# Patient Record
Sex: Male | Born: 1995 | Hispanic: Yes | Marital: Married | Smoking: Current some day smoker
Health system: Southern US, Community
[De-identification: ages and names within clinical notes are randomized; demographics above are authoritative.]

---
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
## 2014-08-18 IMAGING — CR DG CHEST 2V
1 series · 2 of 2 positions shown · non-contrast
Comparison: none

REASON FOR EXAM: productive cough x 3 wks; hx positive PPD
COMMENTS:

PROCEDURE:     DXR - DXR CHEST PA (OR AP) AND LATERAL  - September 08, 2012 [DATE]
RESULT:     Comparison: None.

[Series 1: w chest pa · 0.14mm/px · 2 of 2 slices shown]
[im 1/2]
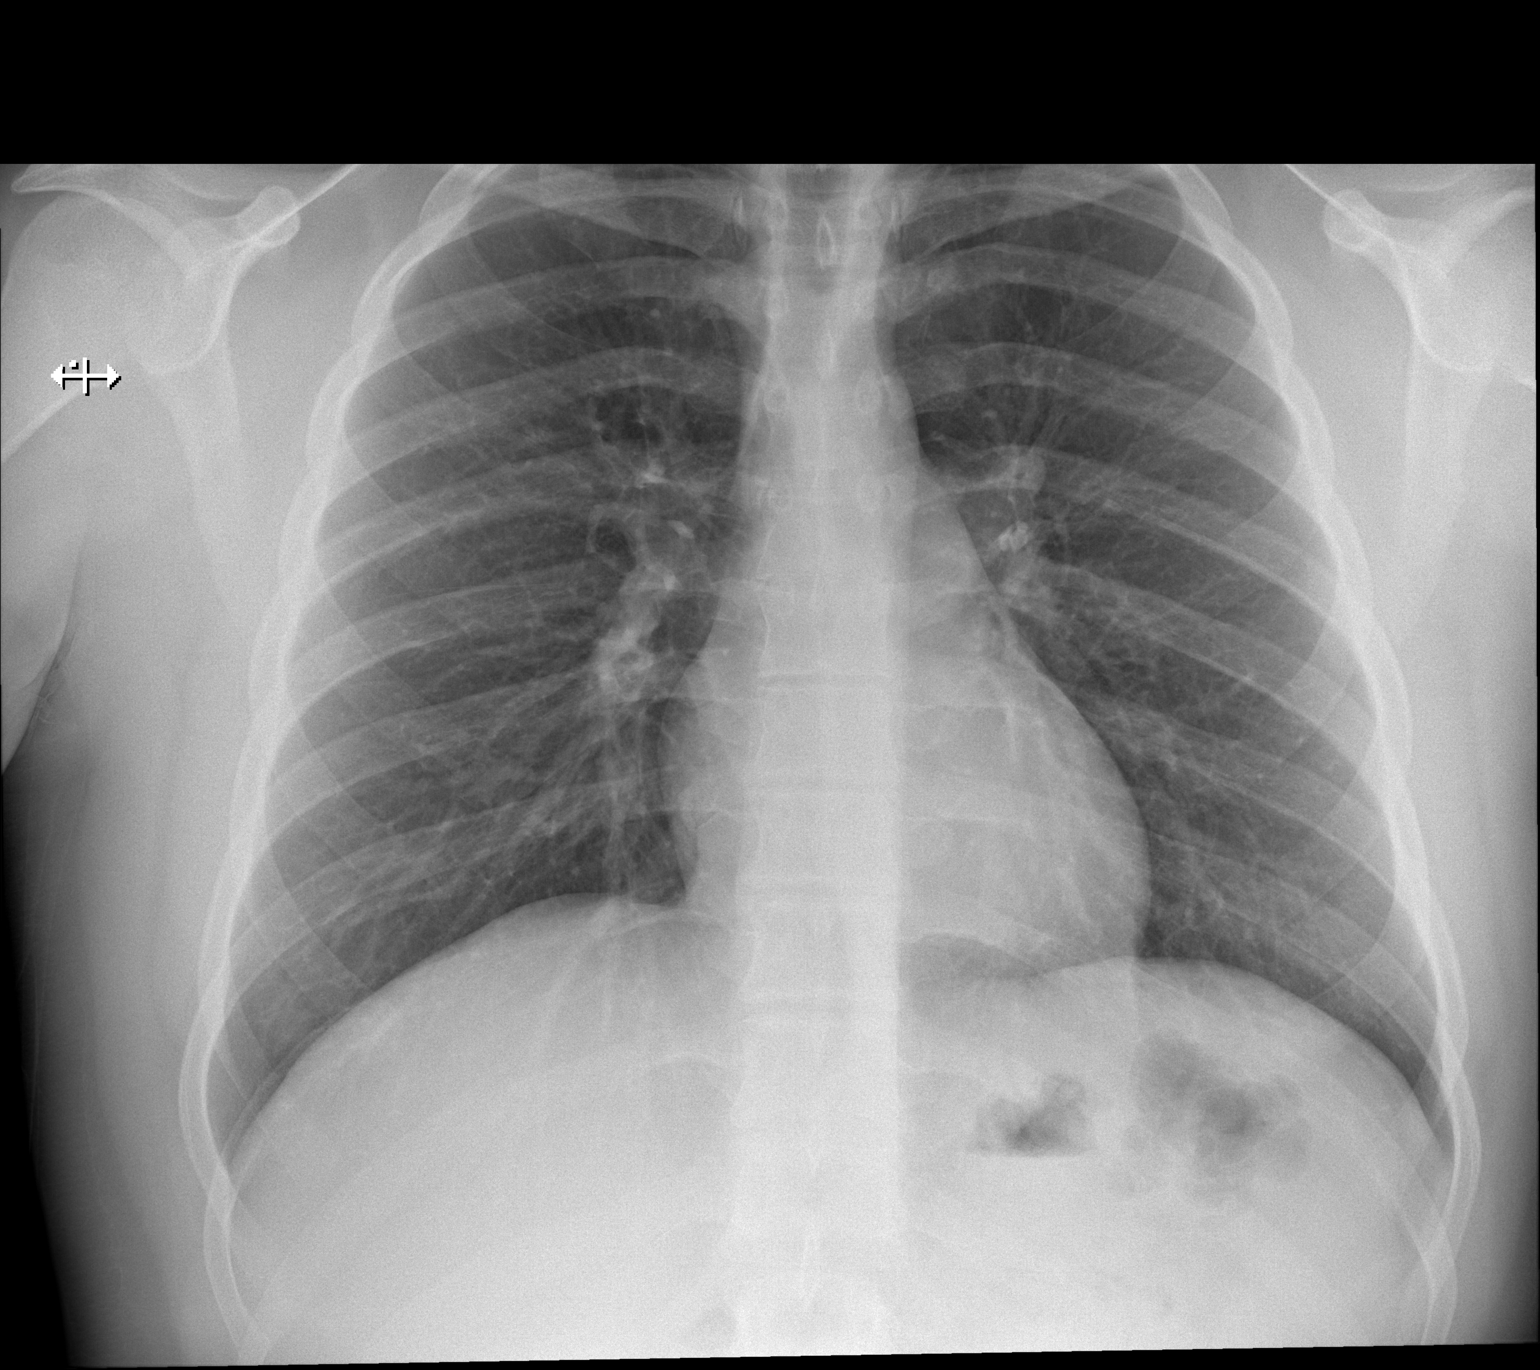
[im 2/2]
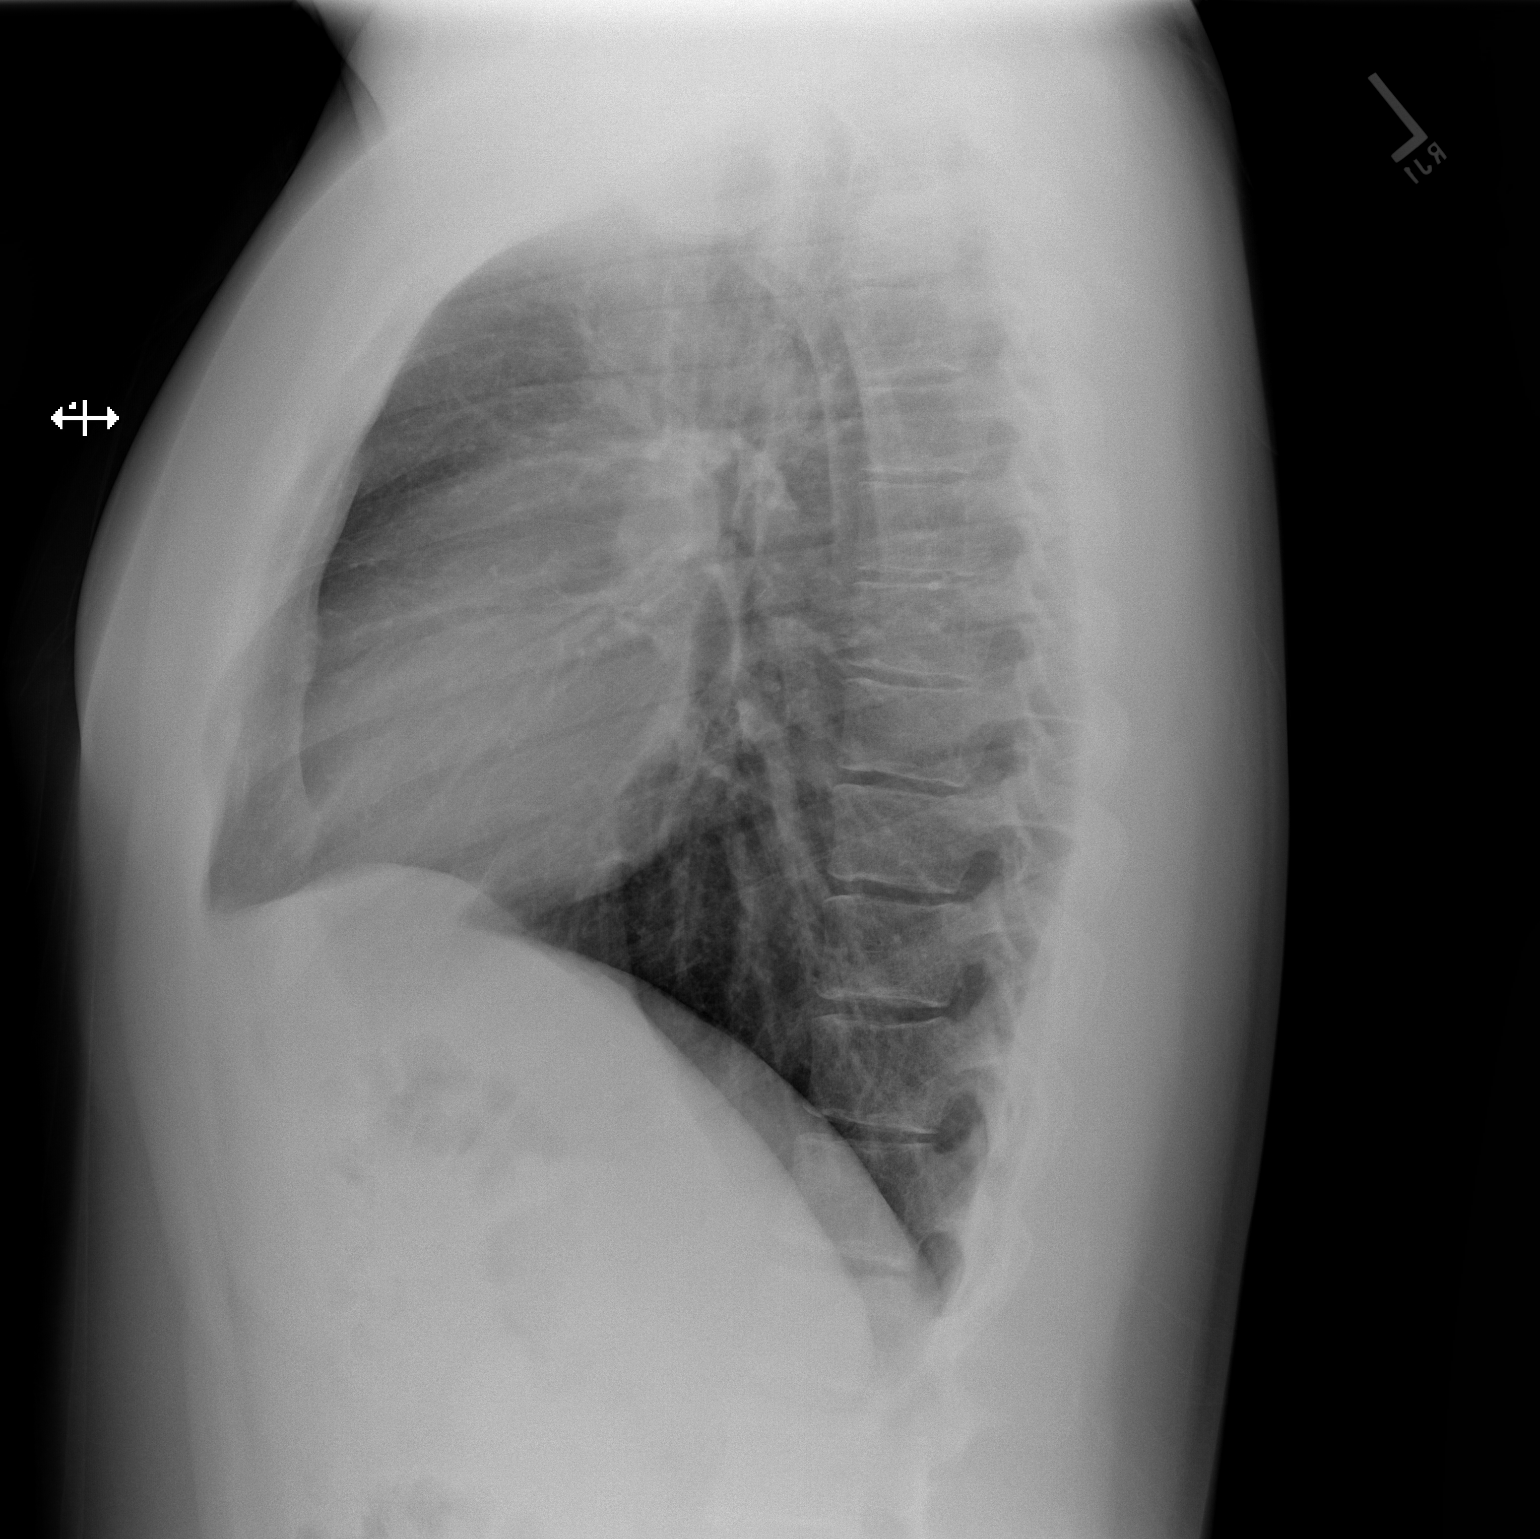

[2 of 2 positions shown; findings below may reference images not displayed]

FINDINGS: The heart and mediastinum are within normal limits. No focal pulmonary
opacities.
IMPRESSION: No acute cardiopulmonary disease.

[REDACTED]

## 2019-11-13 ENCOUNTER — Ambulatory Visit: Payer: Self-pay | Attending: Internal Medicine

## 2019-11-13 DIAGNOSIS — Z23 Encounter for immunization: Secondary | ICD-10-CM

## 2019-11-13 NOTE — Progress Notes (Signed)
   Covid-19 Vaccination Clinic  Name:  Mitchell Hayes    MRN: [id number] DOB: [birth year]  11/13/2019  Mitchell Hayes was observed post Covid-19 immunization for 15 minutes without incident. He was provided with Vaccine Information Sheet and instruction to access the V-Safe system.   Mitchell Hayes was instructed to call 911 with any severe reactions post vaccine: Mitchell Hayes Kitchen Difficulty breathing  . Swelling of face and throat  . A fast heartbeat  . A bad rash all over body  . Dizziness and weakness   Immunizations Administered    Name Date Dose VIS Date Route   Pfizer COVID-19 Vaccine 11/13/2019  7:11 PM 0.3 mL 07/29/2019 Intramuscular   Manufacturer: [employer], [employer]   Lot: [id number]   NDC: [address]-3

## 2019-12-04 ENCOUNTER — Ambulatory Visit: Payer: Self-pay | Attending: Internal Medicine

## 2019-12-04 DIAGNOSIS — Z23 Encounter for immunization: Secondary | ICD-10-CM

## 2019-12-04 NOTE — Progress Notes (Signed)
   Covid-19 Vaccination Clinic  Name:  Mitchell Hayes    MRN: [id number] DOB: 1996/01/20  12/04/2019  Mr. Mitchell Hayes was observed post Covid-19 immunization for 15 minutes without incident. He was provided with Vaccine Information Sheet and instruction to access the V-Safe system.   Mr. Mitchell Hayes was instructed to call 911 with any severe reactions post vaccine: Marland Kitchen Difficulty breathing  . Swelling of face and throat  . A fast heartbeat  . A bad rash all over body  . Dizziness and weakness   Immunizations Administered    Name Date Dose VIS Date Route   Pfizer COVID-19 Vaccine 12/04/2019  6:48 PM 0.3 mL 10/12/2018 Intramuscular   Manufacturer: [employer], [employer]   Lot: [id number]   NDC: [address]-8

## 2021-11-10 ENCOUNTER — Emergency Department
Admission: EM | Admit: 2021-11-10 | Discharge: 2021-11-10 | Disposition: A | Discharge status: Home / Self Care | Payer: Self-pay | Attending: Emergency Medicine | Admitting: Emergency Medicine

## 2021-11-10 ENCOUNTER — Other Ambulatory Visit: Payer: Self-pay

## 2021-11-10 ENCOUNTER — Emergency Department: Payer: Self-pay

## 2021-11-10 DIAGNOSIS — N200 Calculus of kidney: Secondary | ICD-10-CM

## 2021-11-10 DIAGNOSIS — N202 Calculus of kidney with calculus of ureter: Secondary | ICD-10-CM | POA: Insufficient documentation

## 2021-11-10 DIAGNOSIS — N201 Calculus of ureter: Secondary | ICD-10-CM

## 2021-11-10 LAB — URINALYSIS, ROUTINE W REFLEX MICROSCOPIC
Bacteria, UA: NONE SEEN
Bilirubin Urine: NEGATIVE
Glucose, UA: NEGATIVE mg/dL
Ketones, ur: 5 mg/dL — AB
Leukocytes,Ua: NEGATIVE
Nitrite: NEGATIVE
Protein, ur: 100 mg/dL — AB
RBC / HPF: >50 RBC/hpf — ABNORMAL HIGH (ref 0–5)
Specific Gravity, Urine: 1.029 (ref 1.005–1.030)
pH: 5 (ref 5.0–8.0)

## 2021-11-10 LAB — CBC WITH DIFFERENTIAL/PLATELET
Abs Immature Granulocytes: 0.03 10*3/uL (ref 0.00–0.07)
Basophils Absolute: 0 10*3/uL (ref 0.0–0.1)
Basophils Relative: 0 %
Eosinophils Absolute: 0.1 10*3/uL (ref 0.0–0.5)
Eosinophils Relative: 2 %
HCT: 44.8 % (ref 39.0–52.0)
Hemoglobin: 15.4 g/dL (ref 13.0–17.0)
Immature Granulocytes: 0 %
Lymphocytes Relative: 33 %
Lymphs Abs: 2.3 10*3/uL (ref 0.7–4.0)
MCH: 28.4 pg (ref 26.0–34.0)
MCHC: 34.4 g/dL (ref 30.0–36.0)
MCV: 82.7 fL (ref 80.0–100.0)
Monocytes Absolute: 0.3 10*3/uL (ref 0.1–1.0)
Monocytes Relative: 5 %
Neutro Abs: 4.2 10*3/uL (ref 1.7–7.7)
Neutrophils Relative %: 60 %
Platelets: 248 10*3/uL (ref 150–400)
RBC: 5.42 MIL/uL (ref 4.22–5.81)
RDW: 13.2 % (ref 11.5–15.5)
WBC: 7 10*3/uL (ref 4.0–10.5)
nRBC: 0 % (ref 0.0–0.2)

## 2021-11-10 LAB — COMPREHENSIVE METABOLIC PANEL
ALT: 117 U/L — ABNORMAL HIGH (ref 0–44)
AST: 58 U/L — ABNORMAL HIGH (ref 15–41)
Albumin: 4.3 g/dL (ref 3.5–5.0)
Alkaline Phosphatase: 80 U/L (ref 38–126)
Anion gap: 9 (ref 5–15)
BUN: 11 mg/dL (ref 6–20)
CO2: 24 mmol/L (ref 22–32)
Calcium: 8.6 mg/dL — ABNORMAL LOW (ref 8.9–10.3)
Chloride: 105 mmol/L (ref 98–111)
Creatinine, Ser: 1 mg/dL (ref 0.61–1.24)
GFR, Estimated: >60 mL/min (ref >60)
Glucose, Bld: 102 mg/dL — ABNORMAL HIGH (ref 70–99)
Potassium: 3.8 mmol/L (ref 3.5–5.1)
Sodium: 138 mmol/L (ref 135–145)
Total Bilirubin: 0.8 mg/dL (ref 0.3–1.2)
Total Protein: 7.4 g/dL (ref 6.5–8.1)

## 2021-11-10 MED ORDER — TAMSULOSIN HCL 0.4 MG PO CAPS: 0.4000 mg | ORAL_CAPSULE | Freq: Every day | ORAL | 0 refills | Status: DC

## 2021-11-10 MED ORDER — ONDANSETRON HCL 4 MG/2ML IJ SOLN
4.0000 mg | Freq: Once | INTRAMUSCULAR | Status: AC
  Administered 2021-11-10: 4 mg via INTRAVENOUS
  Filled 2021-11-10: qty 2

## 2021-11-10 MED ORDER — LACTATED RINGERS IV BOLUS
1000.0000 mL | Freq: Once | INTRAVENOUS | Status: AC
  Administered 2021-11-10: 1000 mL via INTRAVENOUS

## 2021-11-10 MED ORDER — TAMSULOSIN HCL 0.4 MG PO CAPS
0.4000 mg | ORAL_CAPSULE | Freq: Once | ORAL | Status: AC
Start: 2021-11-10 — End: 2021-11-10
  Administered 2021-11-10: 0.4 mg via ORAL
  Filled 2021-11-10: qty 1

## 2021-11-10 MED ORDER — KETOROLAC TROMETHAMINE 30 MG/ML IJ SOLN
15.0000 mg | Freq: Once | INTRAMUSCULAR | Status: AC
  Administered 2021-11-10: 15 mg via INTRAVENOUS
  Filled 2021-11-10: qty 1

## 2021-11-10 NOTE — Discharge Instructions (Addendum)
Please take Tylenol and ibuprofen/Advil for your pain.  It is safe to take them together, or to alternate them every few hours.  Take up to 1000mg  of Tylenol at a time, up to 4 times per day.  Do not take more than 4000 mg of Tylenol in 24 hours.  For ibuprofen, take 400-600 mg, 4-5 times per day. ? ?Take Flomax/tamsulosin once daily to help relax your ureter.  ? ?If you develop any severely worsening pain, fevers or other worsening symptoms, please return to the ED.  ?

## 2021-11-10 NOTE — ED Triage Notes (Signed)
Pt in with co acute onset of right flank pain this am.  Denies any hx of the same, pain radiates to groin.  ?

## 2021-11-10 NOTE — ED Provider Notes (Signed)
? ?[facility] ?Provider Note ? ? ? Event Date/Time  ? First MD Initiated Contact with Patient 11/10/21 [phone]   ?  (approximate) ? ? ?History  ? ?Flank Pain ? ? ?HPI ? ?Mitchell Hayes is a 26 y.o. male who presents to the ED for evaluation of Flank Pain ?  ?Obese patient without past medical history. ? ?Patient presents to the ED for evaluation of acute right-sided flank pain.  Reports pain started just this morning at 5 AM, right-sided flank pain, radiating towards his right groin.  He has never felt this before.  Nausea and emesis associated with the severe pain.  Dark urine without dysuria or penile discharge.  No history of abdominal surgeries. ? ?Physical Exam  ? ?Triage Vital Signs: ?ED Triage Vitals  ?Enc Vitals Group  ?   BP 11/10/21 0711 (!) 148/91  ?   Pulse Rate 11/10/21 0711 79  ?   Resp 11/10/21 0711 20  ?   Temp 11/10/21 0711 98.6 ?F (37 ?C)  ?   Temp Source 11/10/21 0711 Oral  ?   SpO2 11/10/21 0711 99 %  ?   Weight 11/10/21 0712 255 lb (115.7 kg)  ?   Height 11/10/21 0712 5\' 11"  (1.803 m)  ?   Head Circumference --   ?   Peak Flow --   ?   Pain Score 11/10/21 0712 10  ?   Pain Loc --   ?   Pain Edu? --   ?   Excl. in GC? --   ? ? ?Most recent vital signs: ?Vitals:  ? 11/10/21 0711 11/10/21 0857  ?BP: (!) 148/91 125/73  ?Pulse: 79   ?Resp: 20   ?Temp: 98.6 ?F (37 ?C)   ?SpO2: 99%   ? ? ?General: Awake, no distress.  Obese.  Appears uncomfortable. ?CV:  Good peripheral perfusion.  ?Resp:  Normal effort.  ?Abd:  No distention.  Poorly localizing right-sided tenderness without peritoneal features or guarding. ?MSK:  No deformity noted.  ?Neuro:  No focal deficits appreciated. ?Other:   ? ? ?ED Results / Procedures / Treatments  ? ?Labs ?(all labs ordered are listed, but only abnormal results are displayed) ?Labs Reviewed  ?COMPREHENSIVE METABOLIC PANEL - Abnormal; Notable for the following components:  ?    Result Value  ? Glucose, Bld 102 (*)   ? Calcium 8.6 (*)   ? AST 58 (*)    ? ALT 117 (*)   ? All other components within normal limits  ?URINALYSIS, ROUTINE W REFLEX MICROSCOPIC - Abnormal; Notable for the following components:  ? Color, Urine AMBER (*)   ? APPearance HAZY (*)   ? Hgb urine dipstick LARGE (*)   ? Ketones, ur 5 (*)   ? Protein, ur 100 (*)   ? RBC / HPF >50 (*)   ? All other components within normal limits  ?CBC WITH DIFFERENTIAL/PLATELET  ? ? ?EKG ? ? ?RADIOLOGY ?CT renal study reviewed by me with distal right 5 millimeter ureteral stone ? ?Official radiology report(s): ?CT Renal Stone Study ? ?Result Date: 11/10/2021 ?CLINICAL DATA:  Right flank pain, suspect first-time ureteral stone. EXAM: CT ABDOMEN AND PELVIS WITHOUT CONTRAST TECHNIQUE: Multidetector CT imaging of the abdomen and pelvis was performed following the standard protocol without IV contrast. RADIATION DOSE REDUCTION: This exam was performed according to the departmental dose-optimization program which includes automated exposure control, adjustment of the mA and/or kV according to patient size and/or use of iterative  reconstruction technique. COMPARISON:  None. FINDINGS: Lower chest:  No contributory findings. Hepatobiliary: No focal liver abnormality.No evidence of biliary obstruction or stone. Pancreas: Unremarkable. Spleen: Unremarkable. Adrenals/Urinary Tract: Negative adrenals. Right hydronephrosis and low-density renal expansion with mild perinephric stranding due to a proximal ureteric stone measuring 5 mm on coronal reformats. No additional urolithiasis. Unremarkable bladder. Stomach/Bowel:  No obstruction. No appendicitis. Vascular/Lymphatic: No acute vascular abnormality. No mass or adenopathy. Reproductive:No pathologic findings. Other: No ascites or pneumoperitoneum. Musculoskeletal: No acute abnormalities. IMPRESSION: Right hydronephrosis from a 5 mm proximal ureteric stone. Electronically Signed   By: Tiburcio Pea M.D.   On: 11/10/2021 08:08   ? ?PROCEDURES and  INTERVENTIONS: ? ?Procedures ? ?Medications  ?lactated ringers bolus 1,000 mL (0 mLs Intravenous Stopped 11/10/21 0858)  ?ketorolac (TORADOL) 30 MG/ML injection 15 mg (15 mg Intravenous Given 11/10/21 0729)  ?ondansetron [facility]) injection 4 mg (4 mg Intravenous Given 11/10/21 0729)  ?tamsulosin (FLOMAX) capsule 0.4 mg (0.4 mg Oral Given 11/10/21 0827)  ? ? ? ?IMPRESSION / MDM / ASSESSMENT AND PLAN / ED COURSE  ?I reviewed the triage vital signs and the nursing notes. ? ?28 male presents to the ED with right flank pain and first-time ureterolithiasis suitable for outpatient management.  He presents uncomfortable and clinically like a kidney stone.  No peritoneal features to the abdomen.  No fever or signs of sepsis.  Blood work with normal CBC and his renal function is intact on metabolic panel.  Urine without infectious features, but does demonstrate microscopic hematuria suggestive of ureteral stone.  CT obtained and demonstrates distal ureteral stone.  His pain is well controlled and has no symptoms after fluids and Toradol.  There is a moderate size of the stone, we will start him a short course of Flomax.  No barriers to outpatient management.  We will discharge with return precautions. ? ?Clinical Course as of 11/10/21 1516  ?Wynelle Link Nov 10, 2021  ?0808 Reassessed. Feeling better. We discussed kidney stones [DS]  ?2202 We discussed Flomax.  He is agreeable. [DS]  ?  ?Clinical Course User Index ?[DS] Delton Prairie, MD  ? ? ? ?FINAL CLINICAL IMPRESSION(S) / ED DIAGNOSES  ? ?Final diagnoses:  ?Ureterolithiasis  ?Nephrolithiasis  ? ? ? ?Rx / DC Orders  ? ?ED Discharge Orders   ? ?      Ordered  ?  tamsulosin (FLOMAX) 0.4 MG CAPS capsule  Daily       ? 11/10/21 0817  ? ?  ?  ? ?  ? ? ? ?Note:  This document was prepared using Dragon voice recognition software and may include unintentional dictation errors. ?  ?Delton Prairie, MD ?11/10/21 1517 ? ?

## 2021-12-22 ENCOUNTER — Emergency Department
Admission: EM | Admit: 2021-12-22 | Discharge: 2021-12-22 | Disposition: A | Discharge status: Home / Self Care | Payer: Self-pay | Attending: Student in an Organized Health Care Education/Training Program | Admitting: Student in an Organized Health Care Education/Training Program

## 2021-12-22 ENCOUNTER — Emergency Department: Payer: Self-pay

## 2021-12-22 ENCOUNTER — Other Ambulatory Visit: Payer: Self-pay

## 2021-12-22 DIAGNOSIS — R109 Unspecified abdominal pain: Secondary | ICD-10-CM

## 2021-12-22 DIAGNOSIS — R1031 Right lower quadrant pain: Secondary | ICD-10-CM | POA: Insufficient documentation

## 2021-12-22 DIAGNOSIS — R7401 Elevation of levels of liver transaminase levels: Secondary | ICD-10-CM | POA: Insufficient documentation

## 2021-12-22 LAB — URINALYSIS, ROUTINE W REFLEX MICROSCOPIC
Bacteria, UA: NONE SEEN
Bilirubin Urine: NEGATIVE
Glucose, UA: NEGATIVE mg/dL
Ketones, ur: NEGATIVE mg/dL
Leukocytes,Ua: NEGATIVE
Nitrite: NEGATIVE
Protein, ur: NEGATIVE mg/dL
Specific Gravity, Urine: 1.028 (ref 1.005–1.030)
pH: 5 (ref 5.0–8.0)

## 2021-12-22 LAB — COMPREHENSIVE METABOLIC PANEL
ALT: 89 U/L — ABNORMAL HIGH (ref 0–44)
AST: 43 U/L — ABNORMAL HIGH (ref 15–41)
Albumin: 4.5 g/dL (ref 3.5–5.0)
Alkaline Phosphatase: 93 U/L (ref 38–126)
Anion gap: 7 (ref 5–15)
BUN: 16 mg/dL (ref 6–20)
CO2: 25 mmol/L (ref 22–32)
Calcium: 9.6 mg/dL (ref 8.9–10.3)
Chloride: 108 mmol/L (ref 98–111)
Creatinine, Ser: 1.27 mg/dL — ABNORMAL HIGH (ref 0.61–1.24)
GFR, Estimated: >60 mL/min (ref >60)
Glucose, Bld: 96 mg/dL (ref 70–99)
Potassium: 4.2 mmol/L (ref 3.5–5.1)
Sodium: 140 mmol/L (ref 135–145)
Total Bilirubin: 0.7 mg/dL (ref 0.3–1.2)
Total Protein: 7.6 g/dL (ref 6.5–8.1)

## 2021-12-22 LAB — LIPASE, BLOOD: Lipase: 35 U/L (ref 11–51)

## 2021-12-22 LAB — CBC
HCT: 47.4 % (ref 39.0–52.0)
Hemoglobin: 16.4 g/dL (ref 13.0–17.0)
MCH: 28.5 pg (ref 26.0–34.0)
MCHC: 34.6 g/dL (ref 30.0–36.0)
MCV: 82.4 fL (ref 80.0–100.0)
Platelets: 279 10*3/uL (ref 150–400)
RBC: 5.75 MIL/uL (ref 4.22–5.81)
RDW: 12.9 % (ref 11.5–15.5)
WBC: 10.4 10*3/uL (ref 4.0–10.5)
nRBC: 0 % (ref 0.0–0.2)

## 2021-12-22 MED ORDER — OXYCODONE-ACETAMINOPHEN 5-325 MG PO TABS
1.0000 | ORAL_TABLET | ORAL | Status: DC | PRN
  Administered 2021-12-22: 1 via ORAL
  Filled 2021-12-22: qty 1

## 2021-12-22 MED ORDER — ONDANSETRON 4 MG PO TBDP: 4.0000 mg | ORAL_TABLET | Freq: Three times a day (TID) | ORAL | 0 refills | Status: AC | PRN

## 2021-12-22 MED ORDER — ONDANSETRON 4 MG PO TBDP
4.0000 mg | ORAL_TABLET | Freq: Once | ORAL | Status: AC | PRN
  Administered 2021-12-22: 4 mg via ORAL
  Filled 2021-12-22: qty 1

## 2021-12-22 MED ORDER — KETOROLAC TROMETHAMINE 30 MG/ML IJ SOLN: 15.0000 mg | Freq: Once | INTRAMUSCULAR | Status: DC

## 2021-12-22 MED ORDER — OXYCODONE-ACETAMINOPHEN 5-325 MG PO TABS: 1.0000 | ORAL_TABLET | ORAL | 0 refills | Status: AC | PRN

## 2021-12-22 MED ORDER — KETOROLAC TROMETHAMINE 30 MG/ML IJ SOLN
30.0000 mg | Freq: Once | INTRAMUSCULAR | Status: AC
Start: 2021-12-22 — End: 2021-12-22
  Administered 2021-12-22: 30 mg via INTRAMUSCULAR
  Filled 2021-12-22: qty 1

## 2021-12-22 MED ORDER — TAMSULOSIN HCL 0.4 MG PO CAPS: 0.4000 mg | ORAL_CAPSULE | Freq: Every day | ORAL | 0 refills | Status: AC

## 2021-12-22 NOTE — ED Triage Notes (Signed)
Pt via POV from home. Pt c/o RLQ pain, states he may think its a kidney stone. Pt had a CT scan approx a month ago and had a 5 mm stone. Pt is A&OX4 but seems uncomfortable.  ?

## 2021-12-22 NOTE — Discharge Instructions (Signed)

## 2021-12-22 NOTE — ED Provider Notes (Signed)
, ? ?[facility] ?Provider Note ? ? ? Event Date/Time  ? First MD Initiated Contact with Patient 12/22/21 1837   ?  (approximate) ? ? ?History  ? ?Abdominal Pain ? ? ?HPI ? ?Mitchell Hayes is a 26 y.o. male with recent diagnosis of 5 mm right-sided ureteral stone last month by CT presents to the ER for recurrent right flank pain now radiating to his groin.  States he had pain for a few days after the recent visit and then it subsided.  Does not think he ever passed the stone.  Does not have any fevers does have nausea associated with the pain states is throbbing and colicky in nature.  Started suddenly today. ?  ? ? ?Physical Exam  ? ?Triage Vital Signs: ?ED Triage Vitals  ?Enc Vitals Group  ?   BP 12/22/21 1812 (!) 162/94  ?   Pulse Rate 12/22/21 1812 74  ?   Resp 12/22/21 1812 18  ?   Temp 12/22/21 1812 98.7 ?F (37.1 ?C)  ?   Temp Source 12/22/21 1812 Oral  ?   SpO2 12/22/21 1812 97 %  ?   Weight 12/22/21 1811 255 lb (115.7 kg)  ?   Height 12/22/21 1811 5\' 11"  (1.803 m)  ?   Head Circumference --   ?   Peak Flow --   ?   Pain Score 12/22/21 1810 10  ?   Pain Loc --   ?   Pain Edu? --   ?   Excl. in GC? --   ? ? ?Most recent vital signs: ?Vitals:  ? 12/22/21 1812  ?BP: (!) 162/94  ?Pulse: 74  ?Resp: 18  ?Temp: 98.7 ?F (37.1 ?C)  ?SpO2: 97%  ? ? ? ?Constitutional: Alert  ?Eyes: Conjunctivae are normal.  ?Head: Atraumatic. ?Nose: No congestion/rhinnorhea. ?Mouth/Throat: Mucous membranes are moist.   ?Neck: Painless ROM.  ?Cardiovascular:   Good peripheral circulation. ?Respiratory: Normal respiratory effort.  No retractions.  ?Gastrointestinal: Soft and nontender.  ?Musculoskeletal:  no deformity ?Neurologic:  MAE spontaneously. No gross focal neurologic deficits are appreciated.  ?Skin:  Skin is warm, dry and intact. No rash noted. ?Psychiatric: Mood and affect are normal. Speech and behavior are normal. ? ? ? ?ED Results / Procedures / Treatments  ? ?Labs ?(all labs ordered are listed, but  only abnormal results are displayed) ?Labs Reviewed  ?COMPREHENSIVE METABOLIC PANEL - Abnormal; Notable for the following components:  ?    Result Value  ? Creatinine, Ser 1.27 (*)   ? AST 43 (*)   ? ALT 89 (*)   ? All other components within normal limits  ?URINALYSIS, ROUTINE W REFLEX MICROSCOPIC - Abnormal; Notable for the following components:  ? Color, Urine YELLOW (*)   ? APPearance CLEAR (*)   ? Hgb urine dipstick LARGE (*)   ? All other components within normal limits  ?LIPASE, BLOOD  ?CBC  ? ? ? ?EKG ? ? ? ? ?RADIOLOGY ?Please see ED Course for my review and interpretation. ? ?I personally reviewed all radiographic images ordered to evaluate for the above acute complaints and reviewed radiology reports and findings.  These findings were personally discussed with the patient.  Please see medical record for radiology report. ? ? ? ?PROCEDURES: ? ?Critical Care performed:  ? ?Procedures ? ? ?MEDICATIONS ORDERED IN ED: ?Medications  ?oxyCODONE-acetaminophen (PERCOCET/ROXICET) 5-325 MG per tablet 1 tablet (1 tablet Oral Given 12/22/21 1816)  ?ondansetron (ZOFRAN-ODT) disintegrating tablet 4 mg (  4 mg Oral Given 12/22/21 1816)  ?ketorolac (TORADOL) 30 MG/ML injection 30 mg (30 mg Intramuscular Given 12/22/21 1929)  ? ? ? ?IMPRESSION / MDM / ASSESSMENT AND PLAN / ED COURSE  ?I reviewed the triage vital signs and the nursing notes. ?             ?               ? ?Differential diagnosis includes, but is not limited to, stone, pyelo-, appendicitis, muscular skeletal strain, torsion, colitis, biliary pathology, hepatitis ? ?Patient presenting to the ER for evaluation of right-sided flank pain right lower quadrant pain no fever feels consistent with previous diagnosis of kidney stone.  Suspect that he still has migrating right-sided stone that has not yet passed.  Blood work without any leukocytosis renal function at baseline.  Borderline elevation of LFTs but normal bilirubin and no right upper quadrant pain.  Will order  KUB.  Will provide pain medication. ? ? ?Clinical Course as of 12/22/21 2000  ?Wynelle Link Dec 22, 2021  ?6387 Patient reassessed with resolution of symptoms.  Symptoms consistent with migraine right-sided ureterolithiasis.  This point he would be stable and appropriate for outpatient follow-up discussed return precautions.  Patient agreeable to plan. [PR]  ?  ?Clinical Course User Index ?[PR] Willy Eddy, MD  ? ? ? ?FINAL CLINICAL IMPRESSION(S) / ED DIAGNOSES  ? ?Final diagnoses:  ?Right flank pain  ? ? ? ?Rx / DC Orders  ? ?ED Discharge Orders   ? ?      Ordered  ?  oxyCODONE-acetaminophen (PERCOCET) 5-325 MG tablet  Every 4 hours PRN       ? 12/22/21 2000  ?  ondansetron (ZOFRAN-ODT) 4 MG disintegrating tablet  Every 8 hours PRN       ? 12/22/21 2000  ?  tamsulosin (FLOMAX) 0.4 MG CAPS capsule  Daily       ? 12/22/21 2000  ? ?  ?  ? ?  ? ? ? ?Note:  This document was prepared using Dragon voice recognition software and may include unintentional dictation errors. ?  ?Willy Eddy, MD ?12/22/21 2000 ? ?

## 2023-02-03 ENCOUNTER — Emergency Department
Admission: EM | Admit: 2023-02-03 | Discharge: 2023-02-03 | Disposition: A | Discharge status: Home / Self Care | Payer: Self-pay | Attending: Emergency Medicine | Admitting: Emergency Medicine

## 2023-02-03 ENCOUNTER — Emergency Department: Payer: Self-pay

## 2023-02-03 ENCOUNTER — Encounter: Payer: Self-pay | Admitting: Emergency Medicine

## 2023-02-03 ENCOUNTER — Other Ambulatory Visit: Payer: Self-pay

## 2023-02-03 DIAGNOSIS — R1012 Left upper quadrant pain: Secondary | ICD-10-CM | POA: Insufficient documentation

## 2023-02-03 LAB — COMPREHENSIVE METABOLIC PANEL
ALT: 104 U/L — ABNORMAL HIGH (ref 0–44)
AST: 47 U/L — ABNORMAL HIGH (ref 15–41)
Albumin: 4.6 g/dL (ref 3.5–5.0)
Alkaline Phosphatase: 97 U/L (ref 38–126)
Anion gap: 10 (ref 5–15)
BUN: 11 mg/dL (ref 6–20)
CO2: 22 mmol/L (ref 22–32)
Calcium: 8.8 mg/dL — ABNORMAL LOW (ref 8.9–10.3)
Chloride: 104 mmol/L (ref 98–111)
Creatinine, Ser: 0.84 mg/dL (ref 0.61–1.24)
GFR, Estimated: >60 mL/min (ref >60)
Glucose, Bld: 102 mg/dL — ABNORMAL HIGH (ref 70–99)
Potassium: 3.9 mmol/L (ref 3.5–5.1)
Sodium: 136 mmol/L (ref 135–145)
Total Bilirubin: 0.8 mg/dL (ref 0.3–1.2)
Total Protein: 7.7 g/dL (ref 6.5–8.1)

## 2023-02-03 LAB — CBC
HCT: 48.5 % (ref 39.0–52.0)
Hemoglobin: 16.8 g/dL (ref 13.0–17.0)
MCH: 28.1 pg (ref 26.0–34.0)
MCHC: 34.6 g/dL (ref 30.0–36.0)
MCV: 81.2 fL (ref 80.0–100.0)
Platelets: 271 10*3/uL (ref 150–400)
RBC: 5.97 MIL/uL — ABNORMAL HIGH (ref 4.22–5.81)
RDW: 12.7 % (ref 11.5–15.5)
WBC: 8.3 10*3/uL (ref 4.0–10.5)
nRBC: 0 % (ref 0.0–0.2)

## 2023-02-03 LAB — LIPASE, BLOOD: Lipase: 36 U/L (ref 11–51)

## 2023-02-03 MED ORDER — KETOROLAC TROMETHAMINE 15 MG/ML IJ SOLN
15.0000 mg | Freq: Once | INTRAMUSCULAR | Status: AC
  Administered 2023-02-03: 15 mg via INTRAVENOUS
  Filled 2023-02-03: qty 1

## 2023-02-03 MED ORDER — IOHEXOL 300 MG/ML  SOLN
100.0000 mL | Freq: Once | INTRAMUSCULAR | Status: AC | PRN
  Administered 2023-02-03: 100 mL via INTRAVENOUS

## 2023-02-03 NOTE — ED Provider Notes (Signed)
[facility] Provider Note    Event Date/Time   First MD Initiated Contact with Patient 02/03/23 1025     (approximate)   History   Abdominal Pain   HPI  Mitchell Hayes is a 27 y.o. male who presents for evaluation of left upper quadrant pain.  Patient is worried about his spleen.  He reports that his pain began yesterday and has been intermittent.  It is improved today.  He noticed the pain worsened when he stretched.  He also has noticed intermittent paresthesias in his left arm for the past week, has had tightness around his left shoulder blade.  He denies any neck pain.  He has not had weakness in his arm.  No nausea, vomiting, diarrhea.  There are no problems to display for this patient.         Physical Exam   Triage Vital Signs: ED Triage Vitals  Enc Vitals Group     BP 02/03/23 0955 (!) 147/100     Pulse Rate 02/03/23 0955 86     Resp 02/03/23 0955 18     Temp 02/03/23 0955 97.9 F (36.6 C)     Temp src --      SpO2 02/03/23 0955 100 %     Weight 02/03/23 0956 245 lb (111.1 kg)     Height 02/03/23 0956 5\' 10"  (1.778 m)     Head Circumference --      Peak Flow --      Pain Score 02/03/23 0956 3     Pain Loc --      Pain Edu? --      Excl. in GC? --     Most recent vital signs: Vitals:   02/03/23 0955 02/03/23 1219  BP: (!) 147/100 (!) 138/90  Pulse: 86 80  Resp: 18 18  Temp: 97.9 F (36.6 C)   SpO2: 100% 99%    Physical Exam Vitals and nursing note reviewed.  Constitutional:      General: Awake and alert. No acute distress.    Appearance: Normal appearance. The patient is obese.  HENT:     Head: Normocephalic and atraumatic.     Mouth: Mucous membranes are moist.  Eyes:     General: PERRL. Normal EOMs        Right eye: No discharge.        Left eye: No discharge.     Conjunctiva/sclera: Conjunctivae normal.  Cardiovascular:     Rate and Rhythm: Normal rate and regular rhythm.     Pulses: Normal pulses.   Pulmonary:     Effort: Pulmonary effort is normal. No respiratory distress.     Breath sounds: Normal breath sounds.  Abdominal:     Abdomen is soft. There is very mild left upper quadrant abdominal tenderness to deep palpation.  No epigastric or right upper quadrant tenderness.  Negative Murphy sign.  No rebound or guarding. No distention. Musculoskeletal:        General: No swelling. Normal range of motion.     Cervical back: Normal range of motion and neck supple. No midline cervical spine tenderness.  Full range of motion of neck.  Negative Spurling test.  Negative Lhermitte sign.  Normal strength and sensation in bilateral upper extremities. Normal grip strength bilaterally.  Normal intrinsic muscle function of the hand bilaterally.  Normal radial pulses bilaterally. Mild muscular tenderness around left scapula Skin:    General: Skin is warm and dry.  Capillary Refill: Capillary refill takes less than 2 seconds.     Findings: No rash.  Neurological:     Mental Status: The patient is awake and alert.      ED Results / Procedures / Treatments   Labs (all labs ordered are listed, but only abnormal results are displayed) Labs Reviewed  COMPREHENSIVE METABOLIC PANEL - Abnormal; Notable for the following components:      Result Value   Glucose, Bld 102 (*)    Calcium 8.8 (*)    AST 47 (*)    ALT 104 (*)    All other components within normal limits  CBC - Abnormal; Notable for the following components:   RBC 5.97 (*)    All other components within normal limits  LIPASE, BLOOD  URINALYSIS, ROUTINE W REFLEX MICROSCOPIC     EKG     RADIOLOGY I independently reviewed and interpreted imaging and agree with radiologists findings.     PROCEDURES:  Critical Care performed:   Procedures   MEDICATIONS ORDERED IN ED: Medications  ketorolac (TORADOL) 15 MG/ML injection 15 mg (15 mg Intravenous Given 02/03/23 1047)  iohexol (OMNIPAQUE) 300 MG/ML solution 100 mL (100  mLs Intravenous Contrast Given 02/03/23 1057)     IMPRESSION / MDM / ASSESSMENT AND PLAN / ED COURSE  I reviewed the triage vital signs and the nursing notes.   Differential diagnosis includes, but is not limited to, gastritis, peptic ulcer disease, pancreatitis, splenic enlargement, costochondritis, pleurisy, cervical radiculopathy.  Patient is awake and alert, hemodynamically stable and afebrile.  He is nontoxic in appearance.  He has very mild tenderness palpation to deep palpation in his left upper quadrant, though negative Murphy sign.  LFTs are at his baseline, I do not suspect cholecystitis or biliary colic.  No cervical spine tenderness, negative Spurling and Lhermitte sign do not suspect cervical radiculopathy.  He has full and normal strength and sensation of bilateral upper extremities, normal intrinsic muscle function of the hands, equal bilaterally.  CT scan obtained is negative for any acute findings.  Patient is very reassured by these findings.  His symptoms resolved entirely after the Toradol.  He requested a list of PCPs in the area as he would like to establish care, this was provided.  We discussed return precautions and outpatient follow-up.  Patient or stands and agrees with plan.  He was discharged in stable condition.  Patient's presentation is most consistent with acute complicated illness / injury requiring diagnostic workup.    FINAL CLINICAL IMPRESSION(S) / ED DIAGNOSES   Final diagnoses:  Left upper quadrant abdominal pain     Rx / DC Orders   ED Discharge Orders     None        Note:  This document was prepared using Dragon voice recognition software and may include unintentional dictation errors.   Keturah Shavers 02/03/23 1336    Chesley Noon, MD 02/03/23 1620

## 2023-02-03 NOTE — Discharge Instructions (Addendum)
Your CT scan is normal.  Your blood work is normal.  Please return for any new, worsening, or change in symptoms or other concerns.  It was a pleasure caring for you today.  Please go to the following website to schedule new (and existing) patient appointments:   [email or web address]   The following is a list of primary care offices in the area who are accepting new patients at this time.  Please reach out to one of them directly and let them know you would like to schedule an appointment to follow up on an Emergency Department visit, and/or to establish a new primary care provider (PCP).  There are likely other primary care clinics in the are who are accepting new patients, but this is an excellent place to start:  [facility] Lead physician: Dr Shirlee Latch [address] [place], [place] [address] [phone]  [facility] Lead Physician: Dr Alba Cory [address], [place], [place] [address] [phone]  [facility]  Lead Physician: Dr Olevia Perches [address] [place], [place] [address] [phone]  [facility] Lead Physician: Dr Sofie Hartigan [address], [place], [place] [address] [phone]  [facility] at [facility] Lead Physician: Dr Bari Edward [address] [place], [place], [place] [address] [phone]

## 2023-02-03 NOTE — ED Triage Notes (Signed)
Pt to ED for left sided abd pain started last night. +nausea. States hurt worse after eating or drinking. Pt also reports some left arm tinging this morning that has since subsided. Denies h/a, dizziness. Equal grip and strength.  Verbal orders Dr Larinda Buttery

## 2023-10-20 IMAGING — CT CT RENAL STONE PROTOCOL
2 of 4 series · 16 of 46 positions shown, 18 images · non-contrast
Comparison: None.

CLINICAL DATA: Right flank pain, suspect first-time ureteral stone.



[Series 2: ap without · axial · non-contrast · 0.77mm/px · z∈[-978,-528]mm · 13 of 103 slices shown, 15 images]
[im 7/103  soft-tissue]
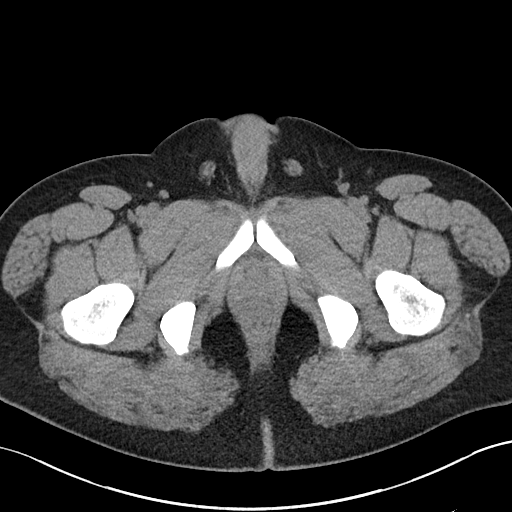
[im 7/103  bone]
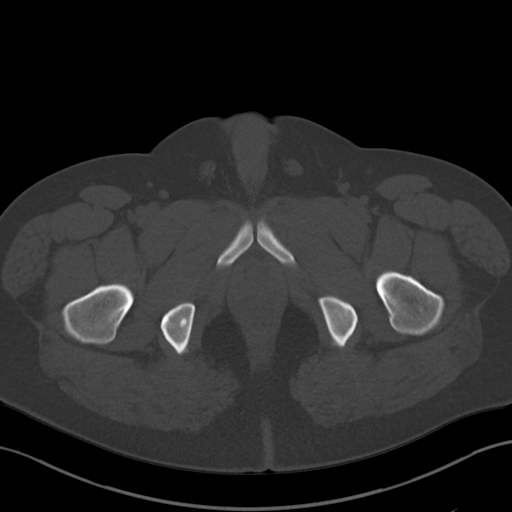
[im 13/103  soft-tissue]
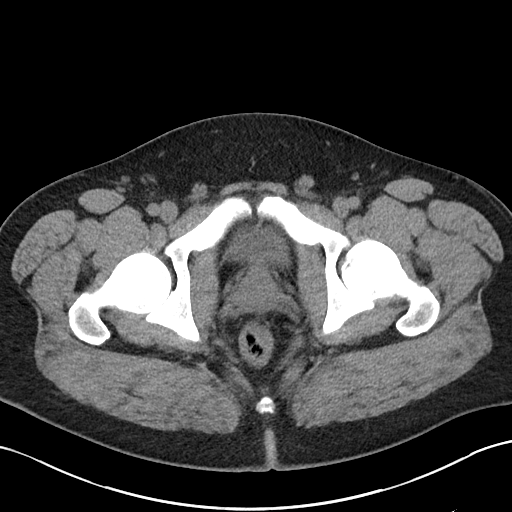
[im 25/103  soft-tissue]
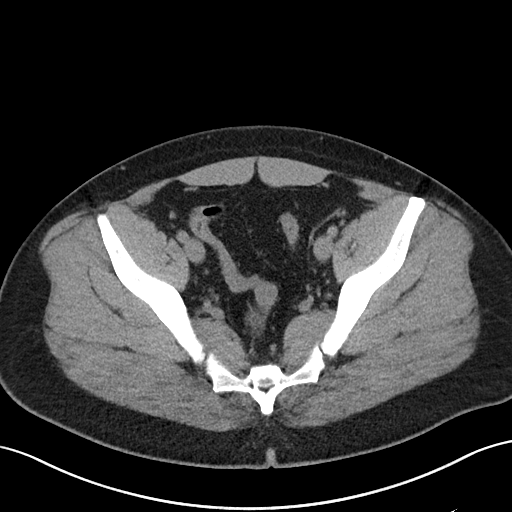
[im 31/103  soft-tissue]
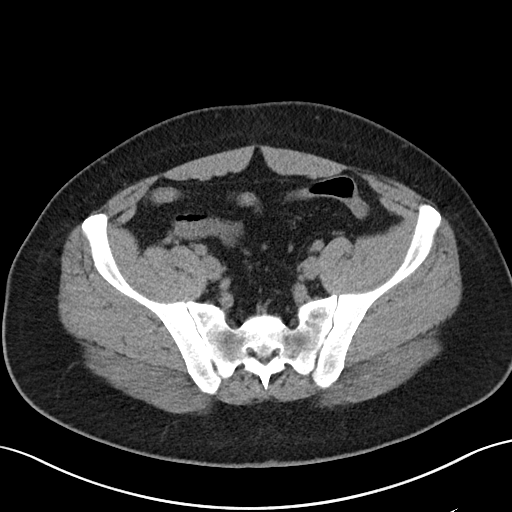
[im 37/103  soft-tissue]
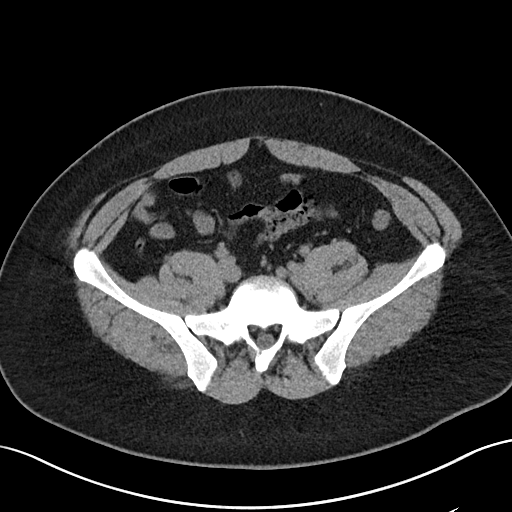
[im 43/103  soft-tissue]
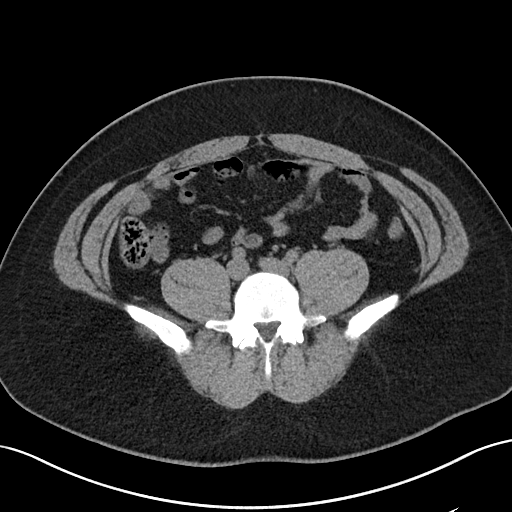
[im 55/103  soft-tissue]
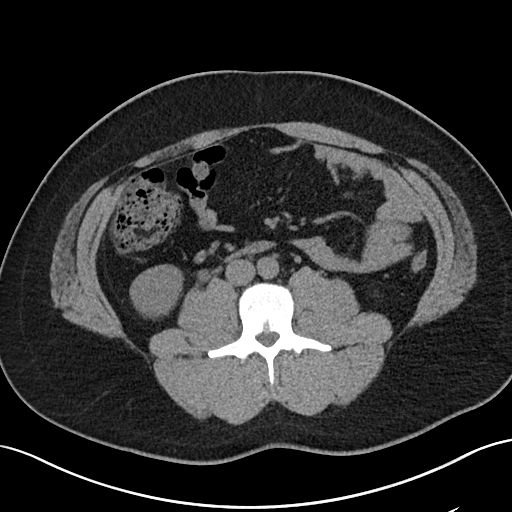
[im 61/103  soft-tissue]
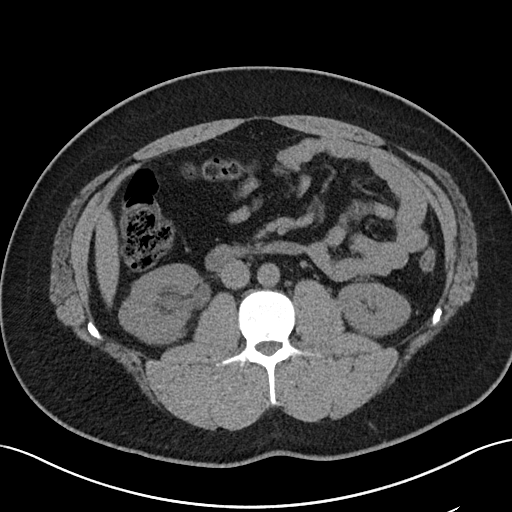
[im 67/103  soft-tissue]
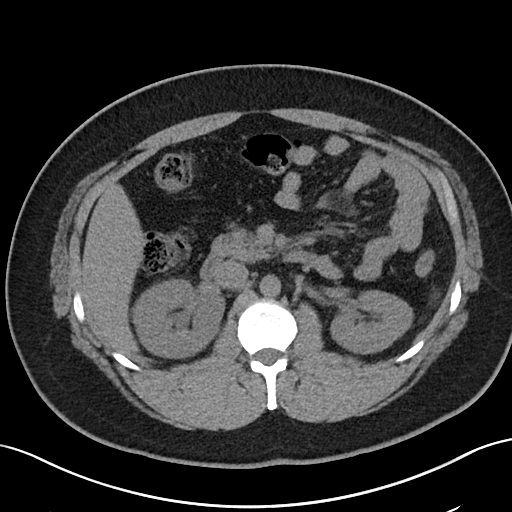
[im 67/103  bone]
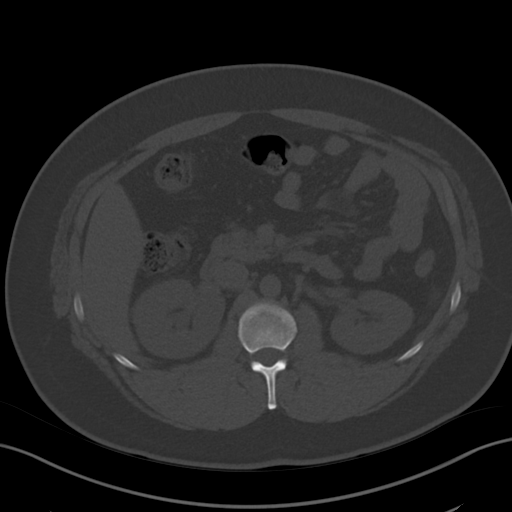
[im 73/103  soft-tissue]
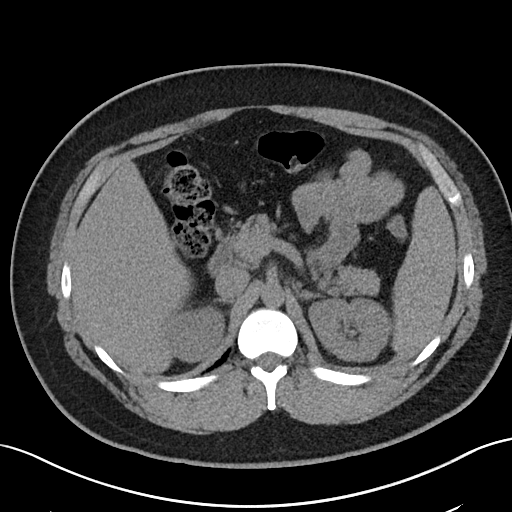
[im 79/103  soft-tissue]
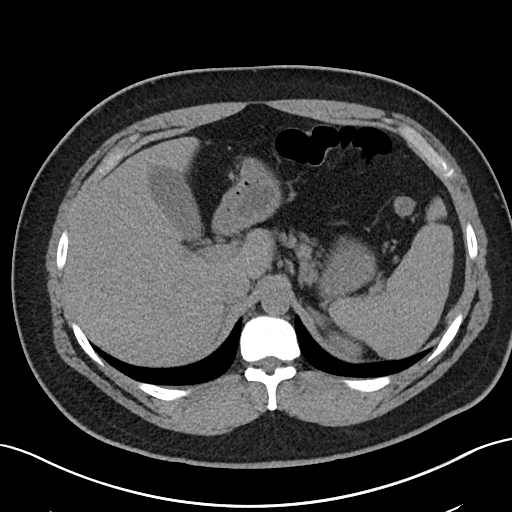
[im 91/103  soft-tissue]
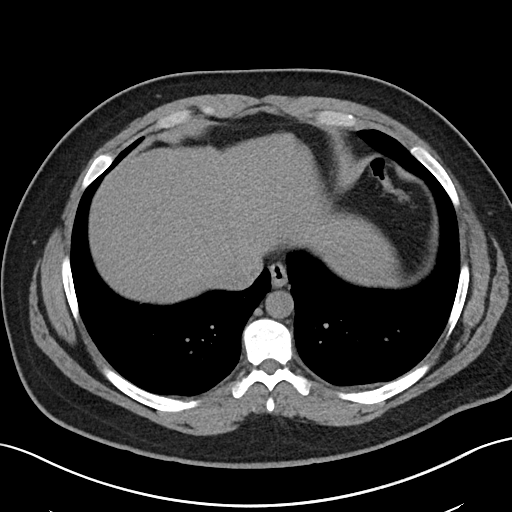
[im 97/103  soft-tissue]
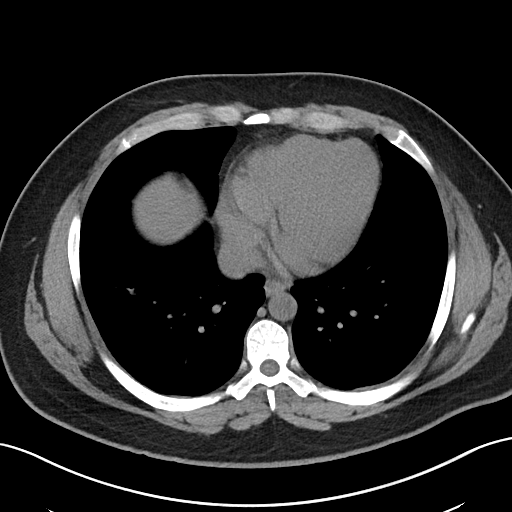

[Series 5: cor · coronal · 0.80mm/px · 3 of 105 slices shown]
[im 35/105  soft-tissue]
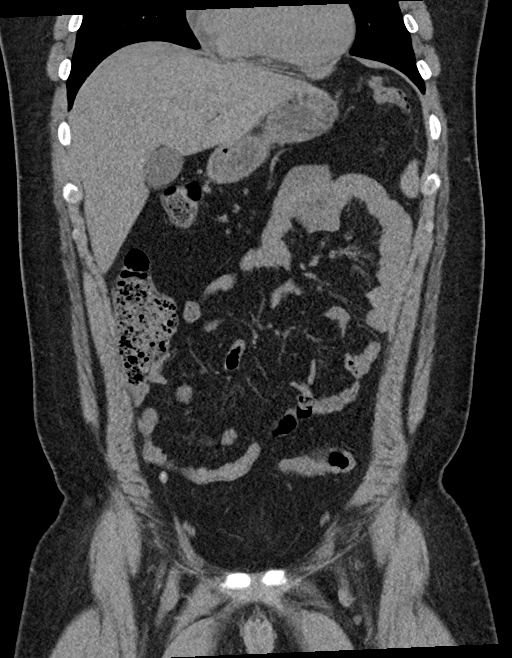
[im 47/105  soft-tissue]
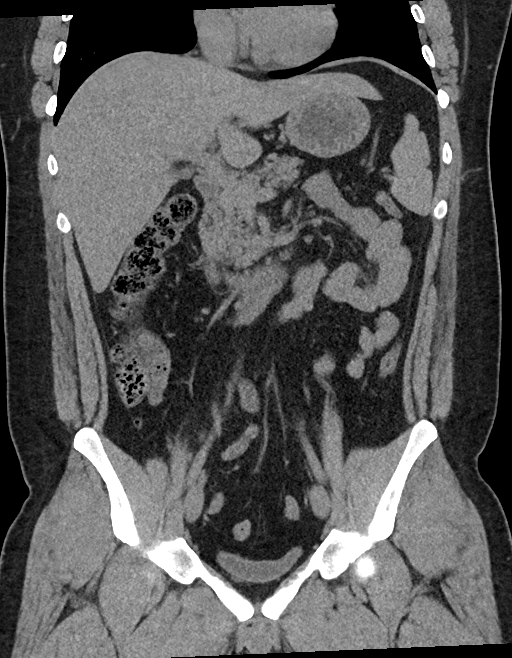
[im 58/105  soft-tissue]
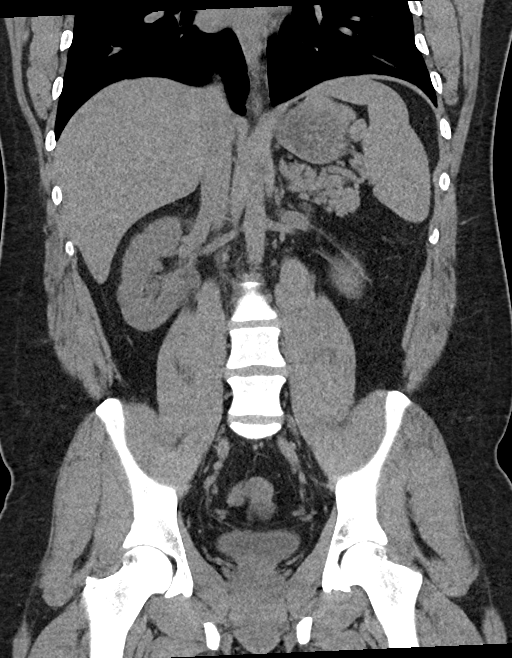

[16 of 46 positions shown; findings below may reference images not displayed]

FINDINGS: Lower chest:  No contributory findings.

Hepatobiliary: No focal liver abnormality.No evidence of biliary
obstruction or stone.

Pancreas: Unremarkable.

Spleen: Unremarkable.

Adrenals/Urinary Tract: Negative adrenals. Right hydronephrosis and
low-density renal expansion with mild perinephric stranding due to a
proximal ureteric stone measuring 5 mm on coronal reformats. No
additional urolithiasis. Unremarkable bladder.

Stomach/Bowel:  No obstruction. No appendicitis.

Vascular/Lymphatic: No acute vascular abnormality. No mass or
adenopathy.

Reproductive:No pathologic findings.

Other: No ascites or pneumoperitoneum.

Musculoskeletal: No acute abnormalities.
IMPRESSION: Right hydronephrosis from a 5 mm proximal ureteric stone.

## 2023-12-01 IMAGING — CR DG ABDOMEN 1V
2 series · 2 of 2 positions shown · non-contrast
Comparison: CT 11/10/2021

CLINICAL DATA: Right flank pain.  Evaluate for stone.

EXAM:
ABDOMEN - 1 VIEW

[abdomen kub (1 of 2)]
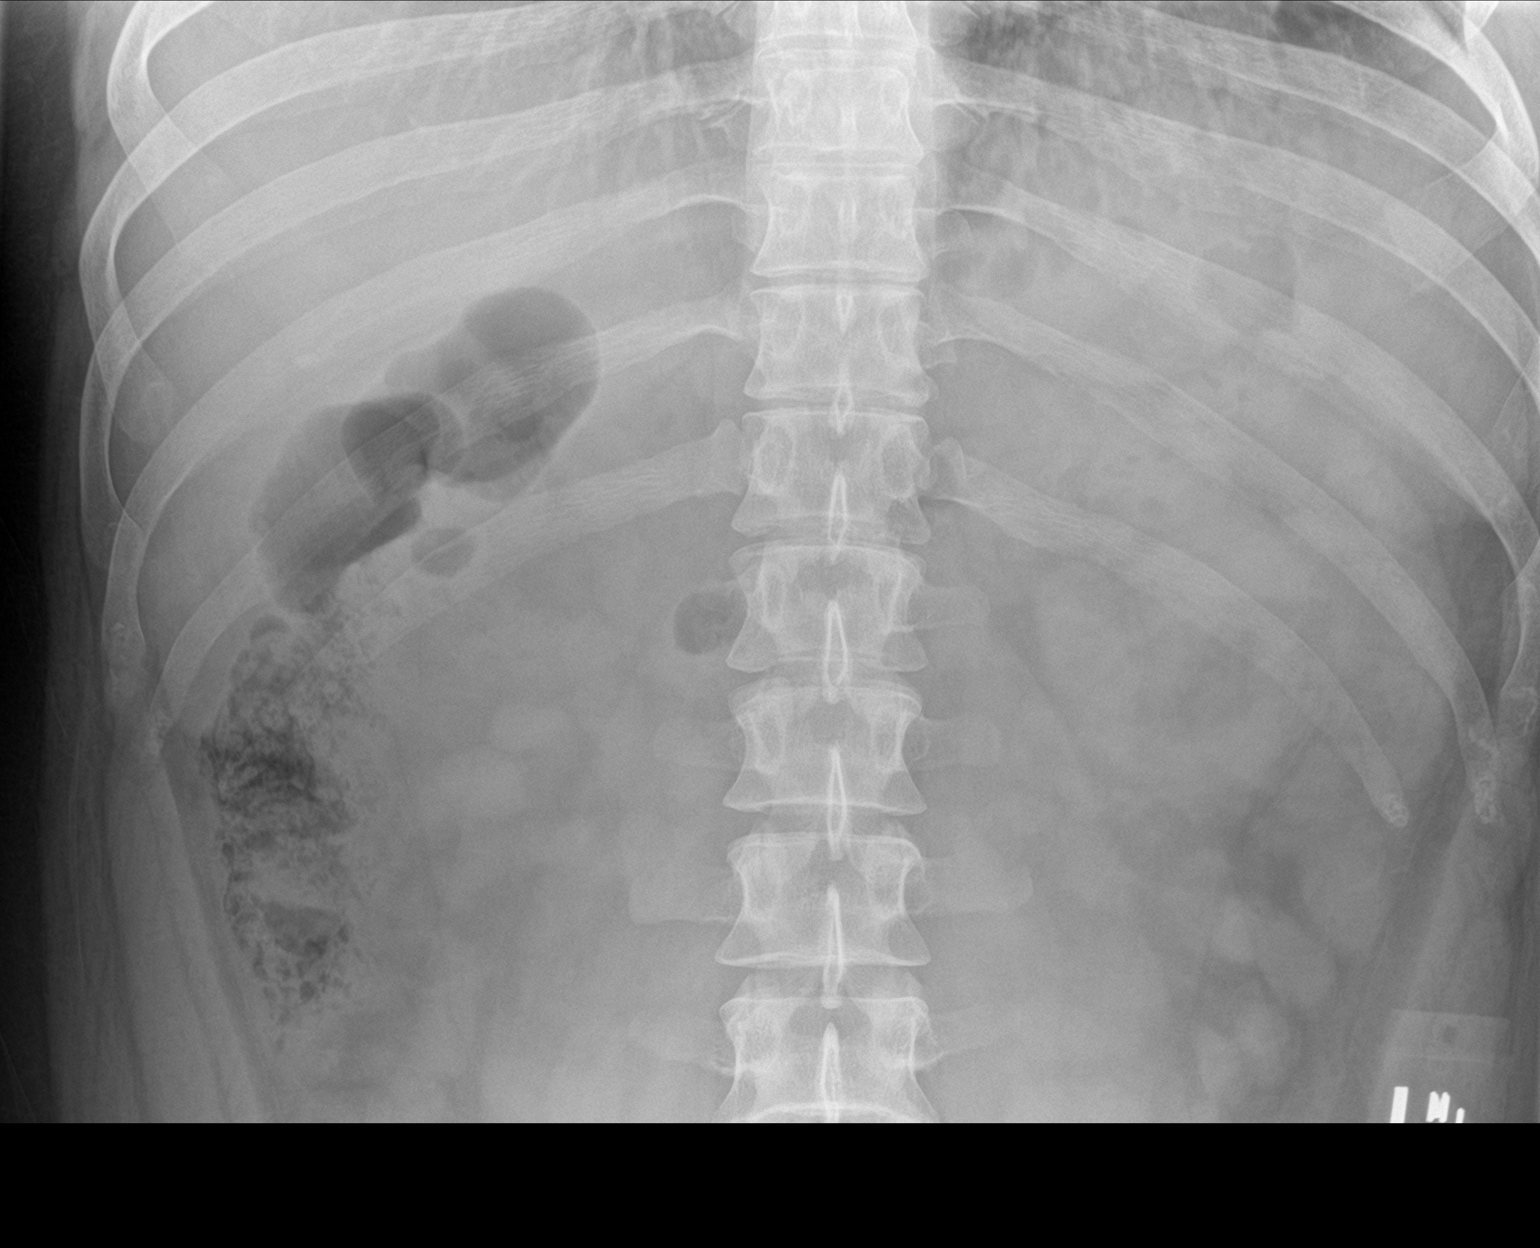

[abdomen kub (2 of 2)]
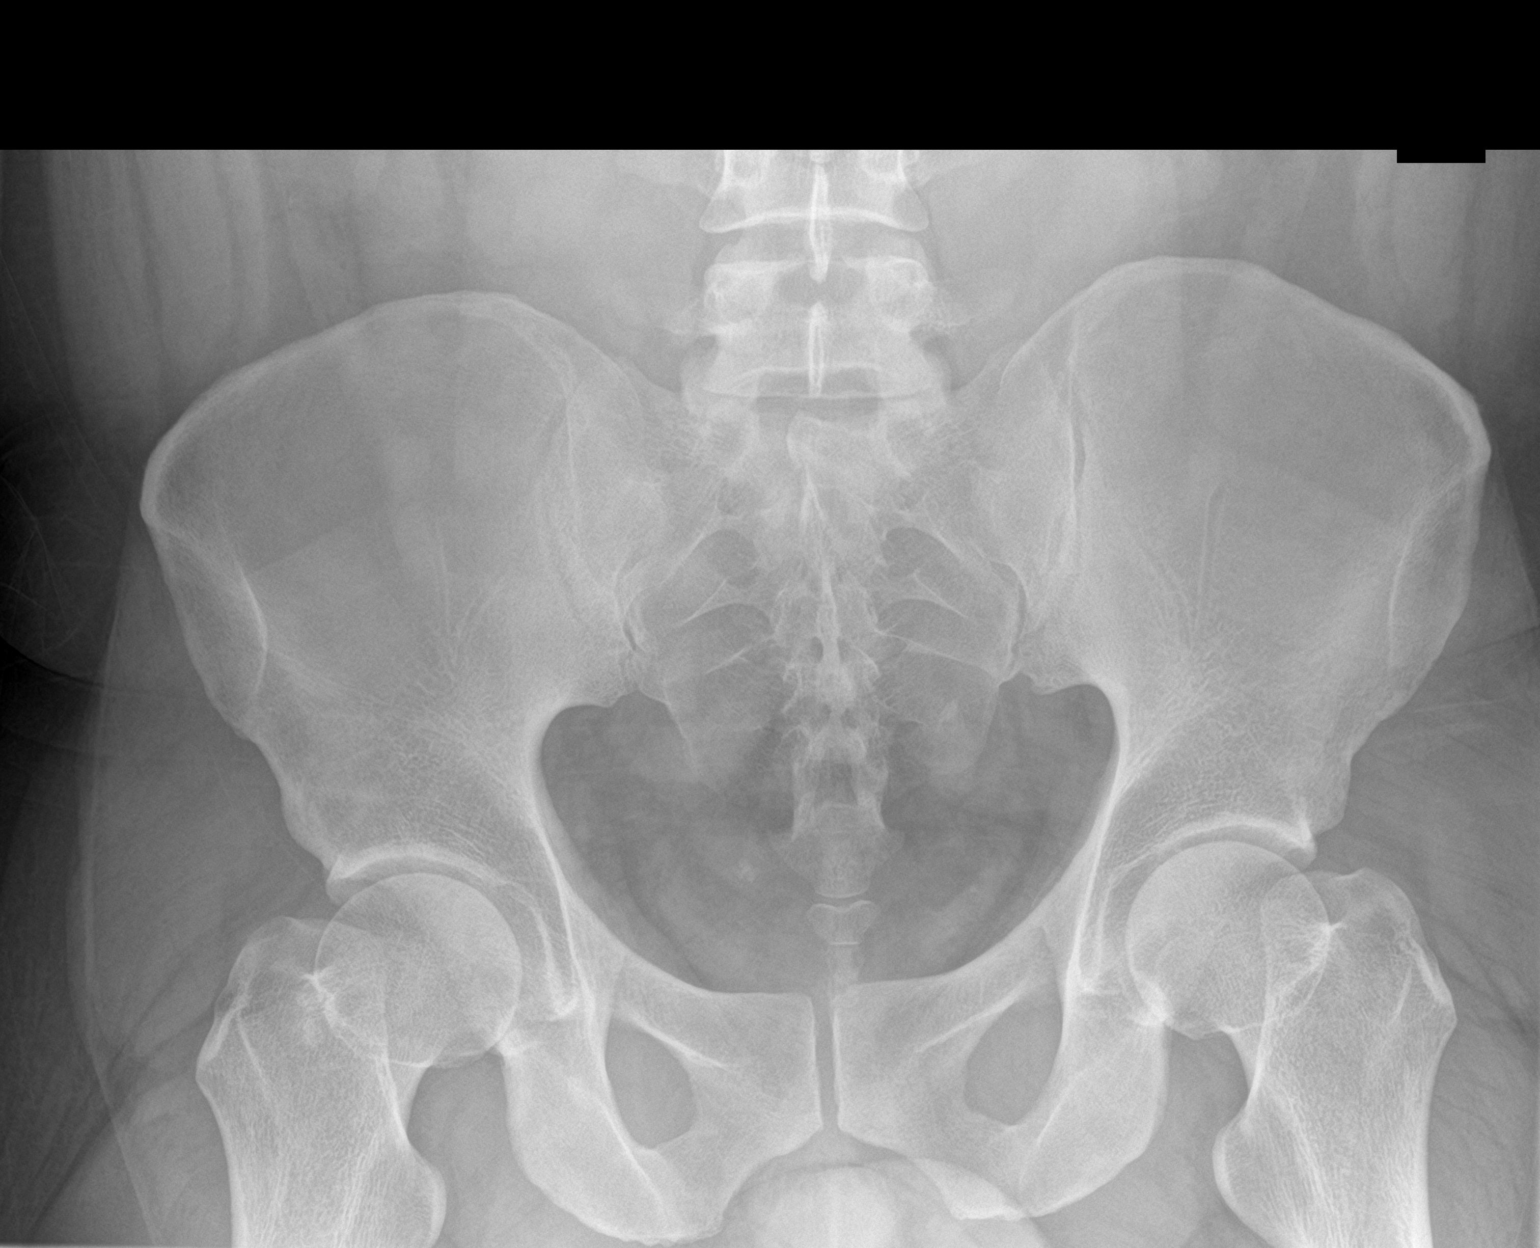

[2 of 2 positions shown; findings below may reference images not displayed]

FINDINGS: Stone on CT in the right proximal ureter is not definitively seen,
there is a potential a 5 mm calcification in the right pelvis. No
phleboliths were seen in this region on CT, and this may represent
distal migration of the previous ureteral stone. No other visible
radiopaque calculi. Normal bowel gas pattern with small volume of
colonic stool. No acute osseous findings.
IMPRESSION: A potential 5 mm calcification in the right pelvis may represent
distal migration of the previously seen right ureteral stone. No
other visible radiopaque calculi in the abdomen or pelvis.

## 2024-09-22 ENCOUNTER — Ambulatory Visit

## 2024-09-22 VITALS — BP 146/75 | HR 91 | Ht 70.0 in | Wt 243.0 lb

## 2024-09-22 DIAGNOSIS — M25532 Pain in left wrist: Secondary | ICD-10-CM

## 2024-09-22 DIAGNOSIS — M25521 Pain in right elbow: Secondary | ICD-10-CM

## 2024-09-22 DIAGNOSIS — M7711 Lateral epicondylitis, right elbow: Secondary | ICD-10-CM

## 2024-09-22 DIAGNOSIS — M25512 Pain in left shoulder: Secondary | ICD-10-CM

## 2024-09-22 DIAGNOSIS — M7552 Bursitis of left shoulder: Secondary | ICD-10-CM

## 2024-09-22 DIAGNOSIS — S60212A Contusion of left wrist, initial encounter: Secondary | ICD-10-CM

## 2024-09-22 NOTE — Progress Notes (Signed)
" ° °  Office Visit Note   Patient: Kaitlin Ardito           Date of Birth: [birth year]           MRN: [id number] Visit Date: 09/22/2024              Requested by: No referring provider defined for this encounter. PCP: Pcp, No   Assessment & Plan: Visit Diagnoses:  1. Right lateral epicondylitis   2. Pain in right elbow   3. Pain in left wrist   4. Acute pain of left shoulder   5. Contusion of left wrist, initial encounter   6. Acute bursitis of left shoulder     Plan: For the elbow, Natural history and expected course discussed. Questions answered. Recommend rest and ice. Reduction in offending activity. Tennis elbow splint. NSAIDs per medication orders. PT referral. Follow-up in 2 months. For the left wrist contusion, recommend rest, ice and NSAIDS For the left shoulder bursitis, Recommend rest and ice. Reduction in offending activity. NSAIDs and PT referral  Orders:  Orders Placed This Encounter  Procedures   DG Elbow Complete Right   DG Wrist Complete Left   DG Shoulder Left     Subjective: Chief Complaint: Right elbow pain, left shoulder and wrist pain  HPI Patient is a 29 y.o. year old male who presents with pain involving the right elbow. Onset of the symptoms was 6 weeks ago. 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 Current symptoms include pain and . Pain is lateral .  Patient has had no prior elbow problems. Treatment to date: none. Also reports injury to the wrist. Reports 1 1/2 weeks ago that a bar rolled onto the front of his wrist. Since that time has had pain with wrist movement. Also complains of left shoulder pain. Reports that pain started a few days ago when lifting weights above his head. Pain is anterolateral. No other complaints.  Objective: Vital Signs: BP (!) 146/75   Pulse 91   Ht 5' 10 (1.778 m)   Wt 110.2 kg   BMI 34.87 kg/m   Physical Exam Gen: Alert, No Acute Distress right elbow: Skin intact, no  erythema or induration noted. lateral epicondylar tenderness, remainder of elbow exam is normal.  Left shoulder: Skin intact. No erythema or induration noted. Full shoulder ROM. TTP subacromial space anterolaterally. + hawkins, negative neer Left wrist: Skin intact. No erythema or induration. No swelling. Full ROM. TTP over hook of hamate. NVID  Imaging: Radiographs personally reviewed by me; reveal no abnormalities of the right elbow. Radiographs of left wrist and left shoulder also reveal no abnormalities   PMFS History: There are no active problems to display for this patient.  No past medical history on file.  No family history on file.  No past surgical history on file. Social History   Occupational History   Not on file  Tobacco Use   Smoking status: Some Days    Types: Cigarettes   Smokeless tobacco: Never  Substance and Sexual Activity   Alcohol use: Not Currently   Drug use: Never   Sexual activity: Yes   Current Outpatient Medications  Medication Instructions   ondansetron  (ZOFRAN -ODT) 4 mg, Oral, Every 8 hours PRN   tamsulosin  (FLOMAX ) 0.4 mg, Oral, Daily   Allergies as of 09/22/2024   (No Known Allergies)   "

## 2024-11-29 ENCOUNTER — Ambulatory Visit
